# Patient Record
Sex: Male | Born: 1935 | Hispanic: Refuse to answer | Marital: Married | Smoking: Former smoker
Health system: Southern US, Community
[De-identification: ages and names within clinical notes are randomized; demographics above are authoritative.]

## PROBLEM LIST (undated history)

## (undated) DIAGNOSIS — E785 Hyperlipidemia, unspecified: Secondary | ICD-10-CM

## (undated) DIAGNOSIS — G629 Polyneuropathy, unspecified: Secondary | ICD-10-CM

## (undated) DIAGNOSIS — I10 Essential (primary) hypertension: Secondary | ICD-10-CM

## (undated) DIAGNOSIS — C4490 Unspecified malignant neoplasm of skin, unspecified: Secondary | ICD-10-CM

## (undated) HISTORY — DX: Hyperlipidemia, unspecified: E78.5

## (undated) HISTORY — DX: Essential (primary) hypertension: I10

## (undated) HISTORY — DX: Unspecified malignant neoplasm of skin, unspecified: C44.90

## (undated) HISTORY — PX: HERNIA REPAIR: SHX51

## (undated) HISTORY — DX: Polyneuropathy, unspecified: G62.9

---

## 2016-02-16 ENCOUNTER — Other Ambulatory Visit: Payer: Self-pay | Admitting: Nurse Practitioner

## 2016-02-16 DIAGNOSIS — R221 Localized swelling, mass and lump, neck: Secondary | ICD-10-CM

## 2016-02-21 ENCOUNTER — Ambulatory Visit
Admission: RE | Admit: 2016-02-21 | Discharge: 2016-02-21 | Disposition: A | Discharge status: Home / Self Care | Payer: Medicare Other | Source: Ambulatory Visit | Attending: Nurse Practitioner | Admitting: Nurse Practitioner

## 2016-02-21 DIAGNOSIS — X58XXXA Exposure to other specified factors, initial encounter: Secondary | ICD-10-CM | POA: Diagnosis not present

## 2016-02-21 DIAGNOSIS — S1085XA Superficial foreign body of other specified part of neck, initial encounter: Secondary | ICD-10-CM | POA: Insufficient documentation

## 2016-02-21 DIAGNOSIS — R221 Localized swelling, mass and lump, neck: Secondary | ICD-10-CM | POA: Diagnosis present

## 2017-01-17 IMAGING — US US SOFT TISSUE HEAD/NECK
1 series · 13 of 25 positions shown · non-contrast
Comparison: None.

CLINICAL DATA: Palpable right-sided neck mass for the past week.
Patient states he was mowing in the yard and something hit him in
the neck. Evaluate for foreign body.

EXAM:
ULTRASOUND OF HEAD/NECK SOFT TISSUES
TECHNIQUE: Ultrasound examination of the head and neck soft tissues was
performed in the area of clinical concern.

[Series 1: us soft tissue head/neck · 0.05mm/px · 25 acquisitions, 13 frames shown]
[im 1/25]
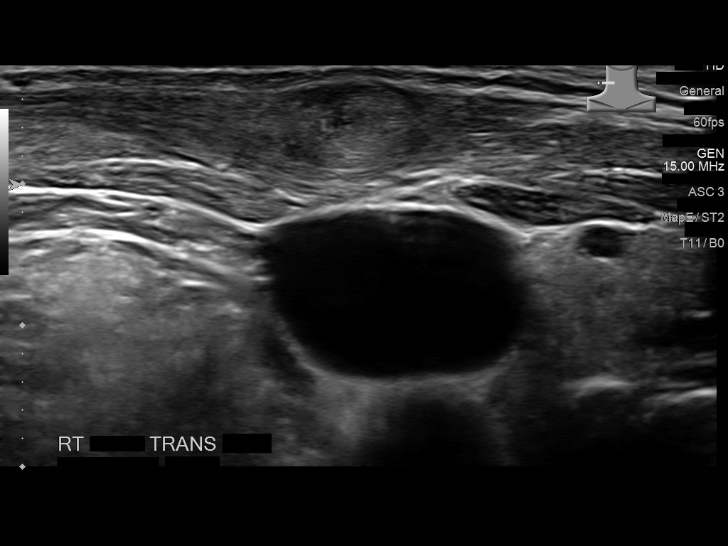
[im 3/25]
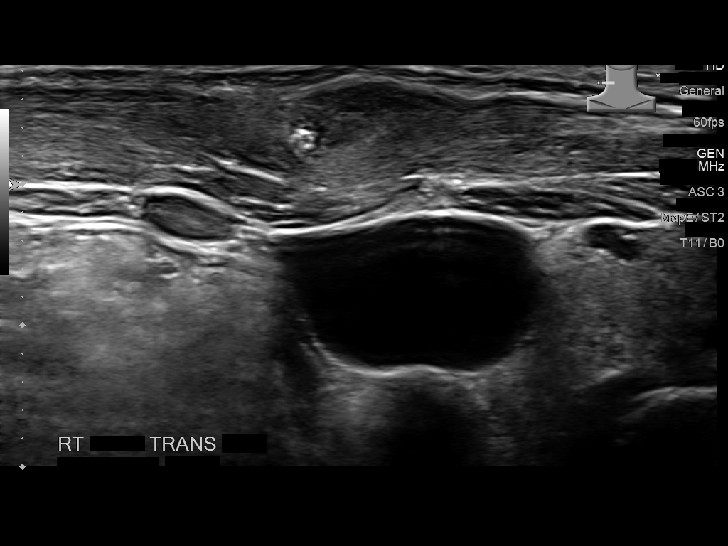
[im 5/25]
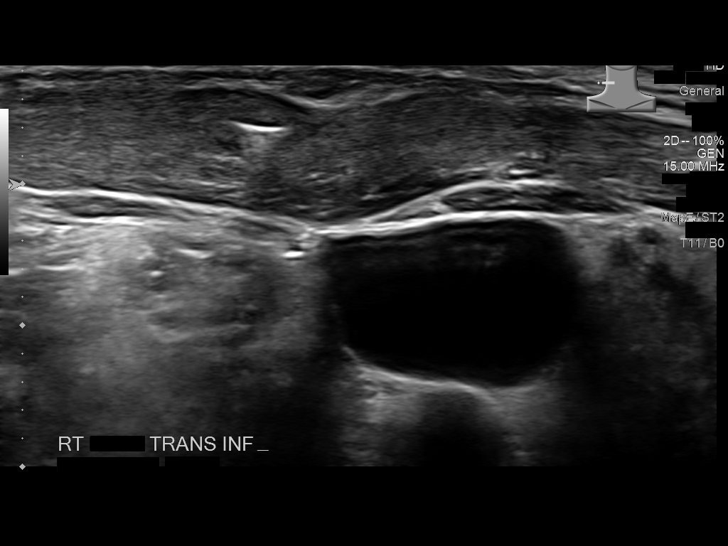
[im 7/25]
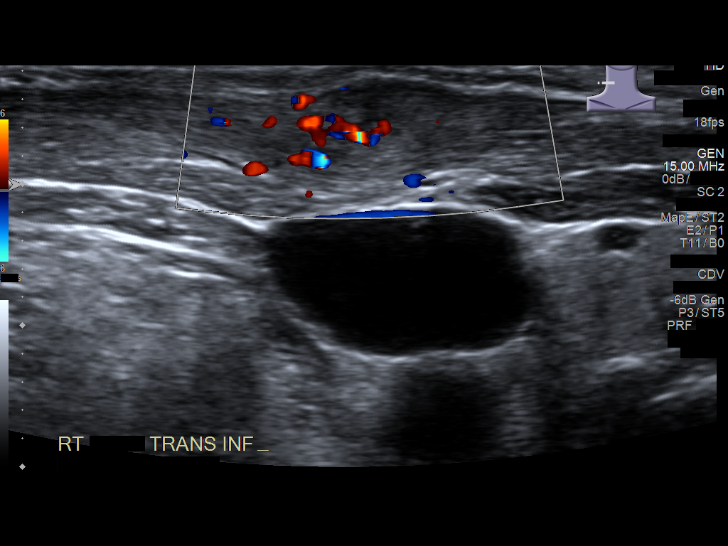
[im 9/25]
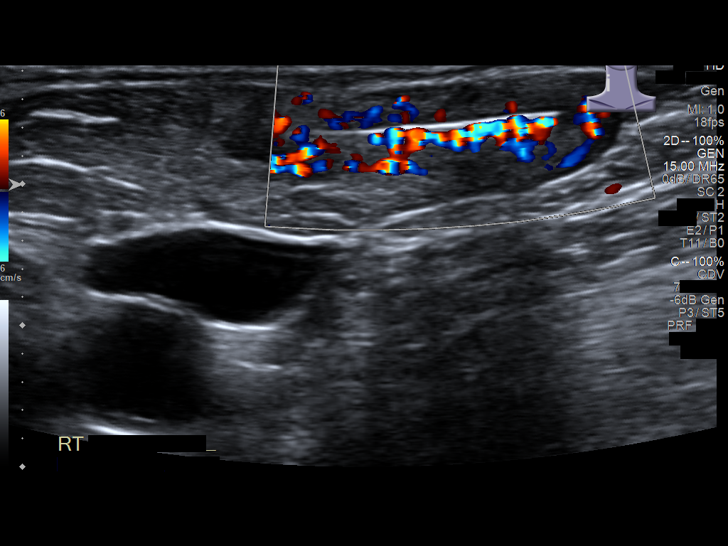
[im 11/25]
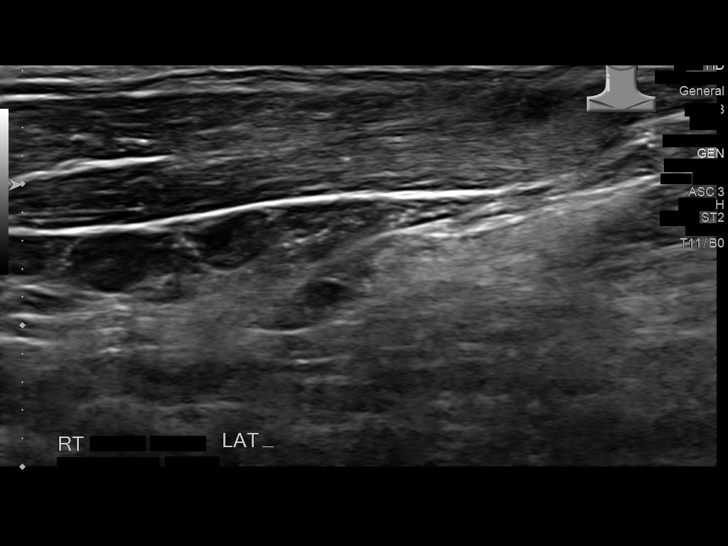
[im 13/25]
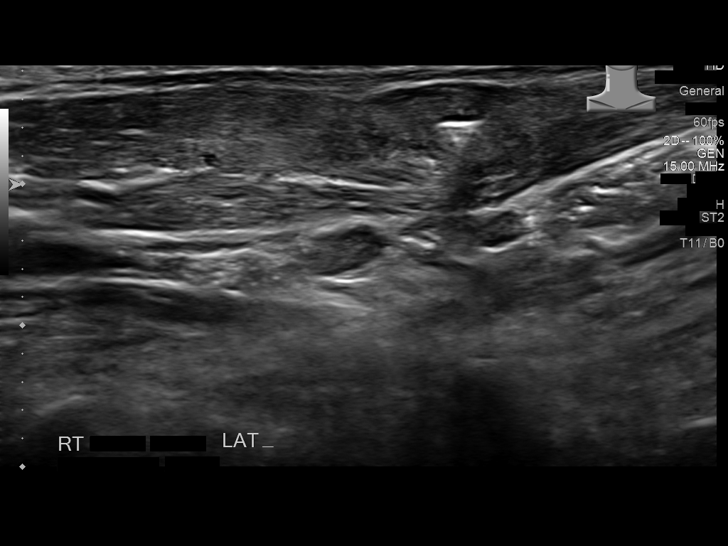
[im 15/25]
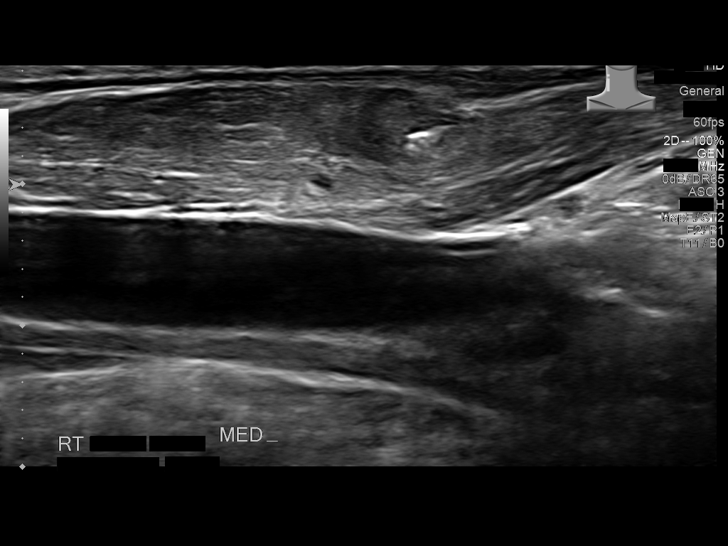
[im 17/25]
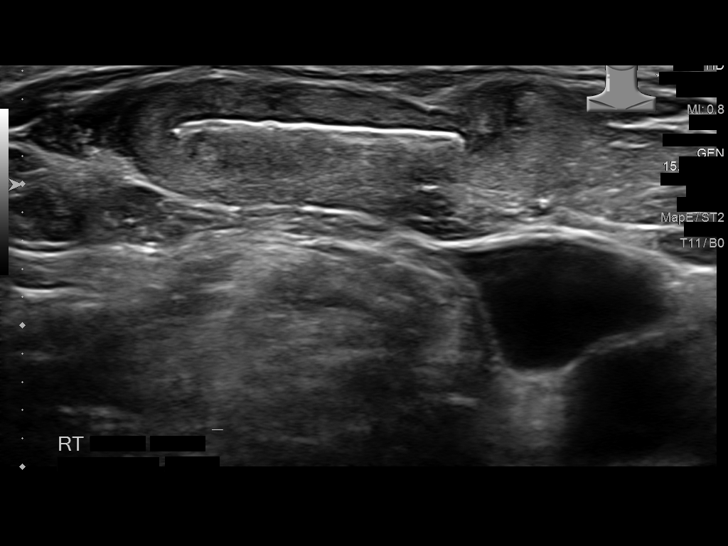
[im 19/25]
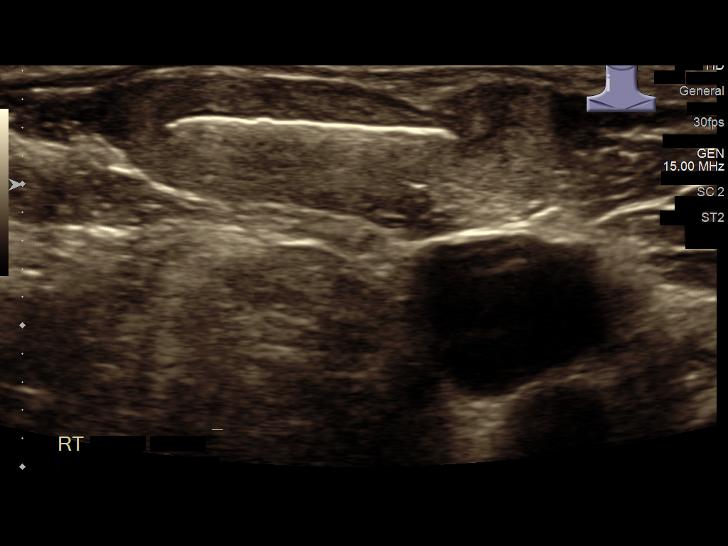
[im 21/25]
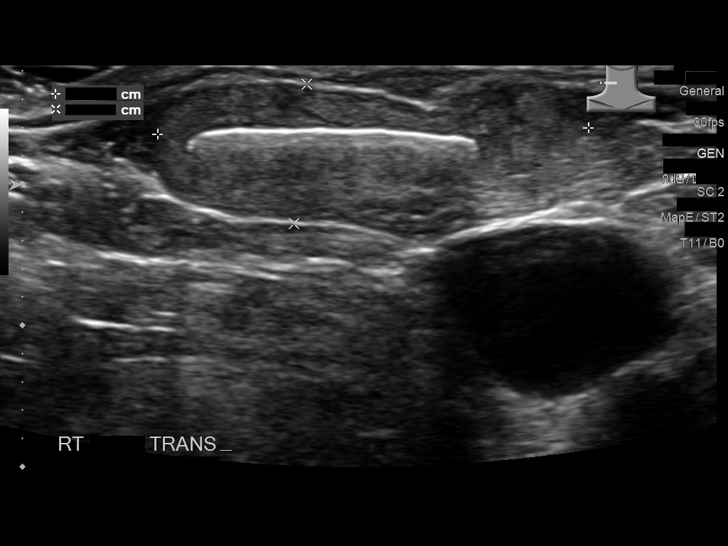
[im 23/25]
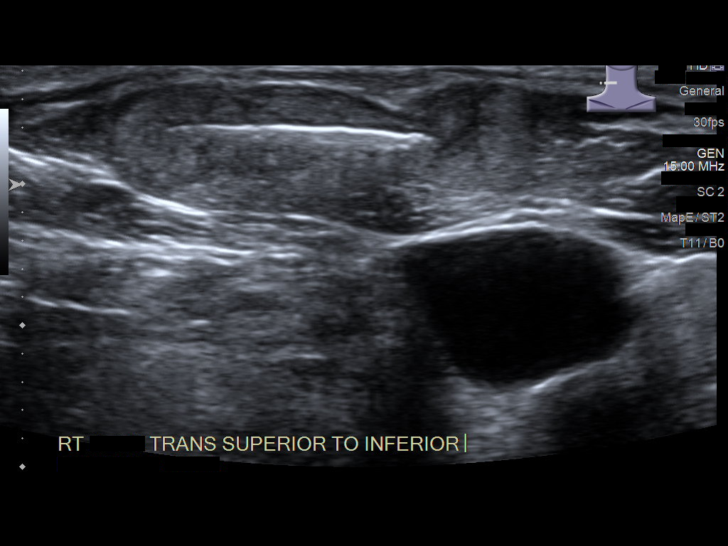
[im 25/25]
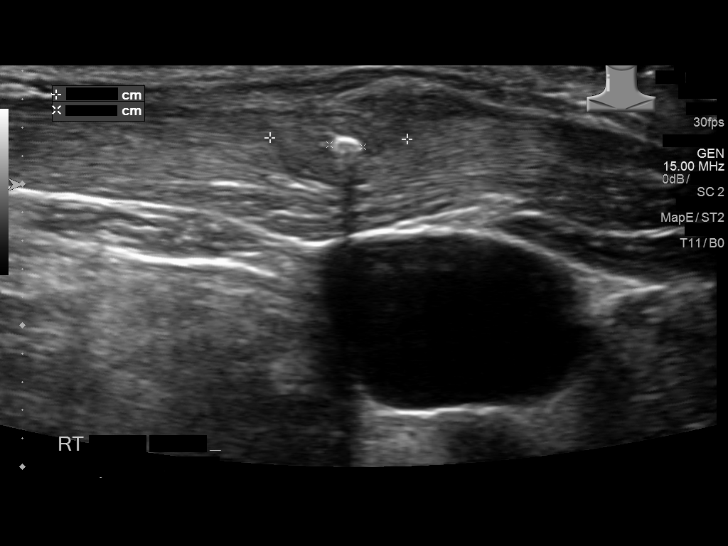

[13 of 25 positions shown; findings below may reference images not displayed]

FINDINGS: There is a approximately 2.9 x 0.4 x 0.2 cm length echogenic linear
apparent foreign body imbedded within the superficial muscles of the
right side of the neck.

This structure is noted to be echogenic with dense posterior
acoustic shadowing.

There is a minimal amount of edematous tissue surrounding this
radiopaque foreign body. No definitive definable/drainable fluid
collection.
IMPRESSION: The patient's palpable area of concern correlates with an
approximately 2.9 cm echogenic linear radiopaque foreign body which
appears imbedded within the superficial musculature of the
right-side of the neck. While there is a minimal amount of adjacent
soft tissue swelling, no definitive definable/drainable fluid
collection is seen on the present examination. Further evaluation
with dedicated soft tissue neck radiographs could be performed as
indicated. Ultimately, this patient would likely benefit from
ENT/surgical consultation.

These results will be called to the ordering clinician or
representative by the Radiologist Assistant, and communication
documented in the PACS or zVision Dashboard.

## 2017-07-16 DIAGNOSIS — Z85820 Personal history of malignant melanoma of skin: Secondary | ICD-10-CM | POA: Insufficient documentation

## 2019-05-12 DIAGNOSIS — E785 Hyperlipidemia, unspecified: Secondary | ICD-10-CM | POA: Insufficient documentation

## 2019-05-12 DIAGNOSIS — I10 Essential (primary) hypertension: Secondary | ICD-10-CM | POA: Insufficient documentation

## 2019-05-12 DIAGNOSIS — K219 Gastro-esophageal reflux disease without esophagitis: Secondary | ICD-10-CM | POA: Insufficient documentation

## 2019-05-12 DIAGNOSIS — G629 Polyneuropathy, unspecified: Secondary | ICD-10-CM | POA: Insufficient documentation

## 2019-07-22 ENCOUNTER — Encounter: Payer: Self-pay | Admitting: Urology

## 2019-07-22 ENCOUNTER — Ambulatory Visit: Payer: Medicare HMO | Admitting: Urology

## 2019-07-22 ENCOUNTER — Other Ambulatory Visit: Payer: Self-pay

## 2019-07-22 VITALS — BP 159/71 | HR 66 | Ht 69.0 in | Wt 163.1 lb

## 2019-07-22 DIAGNOSIS — N432 Other hydrocele: Secondary | ICD-10-CM | POA: Diagnosis not present

## 2019-07-22 NOTE — Progress Notes (Signed)
   07/22/19 1:09 PM   Mark Knox [birth year] [id number]  CC: Scrotal swelling  HPI: I saw Mark Knox in urology clinic today for evaluation of scrotal swelling.  He is an 84 year old healthy male who was noted multiple years of right scrotal swelling.  He thinks this is worsened over the last few months.  His history is notable for bilateral inguinal hernia repairs with mesh in the distant past over 30 years ago.  He thinks he might of had a hydrocelectomy at that time.  He has some occasional weak stream and urinary urgency, but is minimally bothered.  He denies any gross hematuria.  He drinks a fair amount of coffee, computer check, and alcohol during the day.  There is no prior imaging to review.  PMH: Foreign body in the neck Bilateral inguinal hernia repairs  Family History: No family history on file.  Physical Exam: BP (!) 159/71 (BP Location: Left Arm, Patient Position: Sitting, Cuff Size: Normal)   Pulse 66   Ht 5\' 9"  (1.753 m)   Wt 163 lb 2.3 oz (74 kg)   BMI 24.09 kg/m    Constitutional:  Alert and oriented, No acute distress. Cardiovascular: No clubbing, cyanosis, or edema. Respiratory: Normal respiratory effort, no increased work of breathing. GI: Abdomen is soft, nontender, nondistended, no abdominal masses GU: Circumcised phallus with widely patent meatus, testicles 20 cc and descended bilaterally, mild right scrotal swelling consistent with likely hydrocele Lymph: No cervical or inguinal lymphadenopathy. Skin: No rashes, bruises or suspicious lesions. Neurologic: Grossly intact, no focal deficits, moving all 4 extremities. Psychiatric: Normal mood and affect.   Assessment & Plan:   In summary, he is a healthy 84 year old male with some mild right scrotal swelling over the last few years consistent with a likely right-sided hydrocele.  He is minimally bothered by this.  We discussed next step would be a scrotal ultrasound for confirmation, and that hydroceles  are only treated surgically if patients are bothered enough to have them removed.  Surgery typically takes 30 to 45 minutes and is done through a small incision in the scrotum and patients are able to discharge the same day.  There is a small risk of bleeding, infection, or recurrence.  It is normal to have some scrotal swelling for a few weeks during the healing process.  We also reviewed behavioral strategies regarding his bladder symptoms.  He would like to hold off on scrotal ultrasound or any other work-up at this time as he is minimally bothered.  RTC 1 year for symptom check, re-discuss scrotal ultrasound/hydrocelectomy  I spent 30 total minutes on the day of the encounter including pre-visit review of the medical record, face-to-face time with the patient, and post visit ordering of labs/imaging/tests.  Nickolas Madrid, MD 07/22/2019  [facility] [address], [place], [place] [address] [phone]

## 2019-07-22 NOTE — Patient Instructions (Signed)
Hydrocelectomy, Adult  A hydrocelectomy is a surgical procedure to remove a collection of fluid (hydrocele) from the scrotum, which is the pouch that holds the testicles. You may need to have this procedure if a hydrocele is causing painful swelling in your scrotum. Tell a health care provider about:  Any allergies you have.  All medicines you are taking, including vitamins, herbs, eye drops, creams, and over-the-counter medicines.  Any problems you or family members have had with anesthetic medicines.  Any blood disorders you have.  Any surgeries you have had.  Any medical conditions you have. What are the risks? Generally, this is a safe procedure. However, problems may occur, including:  Bleeding into the scrotum (scrotal hematoma).  Damage to nearby structures or organs, including to the testicle or the tube that carries sperm out of the testicle (vas deferens).  Infection.  Allergic reactions to medicines. What happens before the procedure? Staying hydrated Follow instructions from your health care provider about hydration, which may include:  Up to 2 hours before the procedure - you may continue to drink clear liquids, such as water, clear fruit juice, black coffee, and plain tea. Eating and drinking restrictions Follow instructions from your health care provider about eating and drinking, which may include:  8 hours before the procedure - stop eating heavy meals or foods, such as meat, fried foods, or fatty foods.  6 hours before the procedure - stop eating light meals or foods, such as toast or cereal.  6 hours before the procedure - stop drinking milk or drinks that contain milk.  2 hours before the procedure - stop drinking clear liquids. Medicines Ask your health care provider about:  Changing or stopping your regular medicines. This is especially important if you are taking diabetes medicines or blood thinners.  Taking medicines such as aspirin and ibuprofen.  These medicines can thin your blood. Do not take these medicines unless your health care provider tells you to take them.  Taking over-the-counter medicines, vitamins, herbs, and supplements. General instructions  Do not use any products that contain nicotine or tobacco for at least 4 weeks before the procedure. These products include cigarettes, e-cigarettes, and chewing tobacco. If you need help quitting, ask your health care provider.  Plan to have someone take you home from the hospital or clinic.  Plan to have a responsible adult care for you for at least 24 hours after you leave the hospital or clinic. This is important.  Ask your health care provider: ? How your surgery site will be marked. ? What steps will be taken to help prevent infection. These may include:  Removing hair at the surgery site.  Washing skin with a germ-killing soap.  Taking antibiotic medicine. What happens during the procedure?  An IV will be inserted into one of your veins.  You will be given one or more of the following: ? A medicine to make you relax (sedative). ? A medicine to make you fall asleep (general anesthetic).  A small incision will be made through the skin of your scrotum.  Your testicle and the hydrocele will be located, and the hydrocele sac will be opened with an incision.  The fluid will be drained from the hydrocele. Part of the hydrocele sac may be removed.  The hydrocele will be closed with stitches that dissolve (absorbable sutures). This prevents fluid from building up again.  If your hydrocele is large, you may have a thin, rubber drain placed to allow fluid to drain  after the procedure.  The incision in your scrotum will be closed with absorbable sutures, skin glue, or adhesives.  A bandage (dressing) will be placed over the incision. The dressing may be held in place with an athletic support strap (scrotal support). The procedure may vary among health care providers and  hospitals. What happens after the procedure?   Your blood pressure, heart rate, breathing rate, and blood oxygen level will be monitored until you leave the hospital or clinic.  You will be given pain medicine as needed.  Your IV will be removed, and your insertion site will be checked for bleeding.  Do not drive for 24 hours if you were given a sedative during your procedure.  You may need to wear a scrotal support. This holds the dressing in place and supports your scrotum. Summary  A hydrocelectomy is a surgical procedure to remove a collection of fluid (hydrocele) from the scrotum, which is the pouch that holds the testicles. You may need to have this procedure if a hydrocele is causing painful swelling in your scrotum.  During the procedure, the hydrocele will be drained and then closed with stitches that dissolve (absorbable sutures). This prevents fluid from building up again.  If your hydrocele is large, you may have a thin, rubber drain placed to allow fluid to drain after the procedure.  You may need to wear a scrotal support after your procedure. This holds the dressing in place and supports your scrotum. This information is not intended to replace advice given to you by your health care provider. Make sure you discuss any questions you have with your health care provider. Document Revised: 09/24/2018 Document Reviewed: 09/24/2018 Elsevier Patient Education  2020 Else[employer] Inc.   Hydrocele, Adult A hydrocele is a collection of fluid in the loose pouch of skin that holds the testicles (scrotum). This may happen because:  The amount of fluid produced in the scrotum is not absorbed by the rest of the body.  Fluid from the abdomen fills the scrotum. Normally, the testicles develop in the abdomen then move (drop) into to the scrotum before birth. The tube that the testicles travel through usually closes after the testicles drop. If the tube does not close, fluid from the abdomen  can fill the scrotum. This is less common in adults. What are the causes? The cause of a hydrocele in adults is usually not known. However, it may be caused by:  An injury to the scrotum.  An infection (epididymitis).  Decreased blood flow to the scrotum.  Twisting of a testicle (testicular torsion).  A birth defect.  A tumor or cancer of the testicle. What are the signs or symptoms? A hydrocele feels like a water-filled balloon. It may also feel heavy. Other symptoms include:  Swelling of the scrotum. The swelling may decrease when you lie down. You may also notice more swelling at night than in the morning.  Swelling of the groin.  Mild discomfort in the scrotum.  Pain. This can develop if the hydrocele was caused by infection or twisting. The larger the hydrocele, the more likely you are to have pain. How is this diagnosed? This condition may be diagnosed based on:  Physical exam.  Medical history. You may also have other tests, including:  Imaging tests, such as ultrasound.  Blood or urine tests. How is this treated? Most hydroceles go away on their own. If you have no discomfort or pain, your health care provider may suggest close monitoring of your   condition (called watch and wait or watchful waiting) until the condition goes away or symptoms develop. If treatment is needed, it may include:  Treating an underlying condition. This may include using an antibiotic medicine to treat an infection.  Surgery to stop fluid from collecting in the scrotum.  Surgery to drain the fluid. Options include: ? Needle aspiration. A needle is used to drain fluid. However, the fluid buildup will come back quickly. ? Hydrocelectomy. For this procedure, an incision is made in the scrotum to remove the fluid sac. Follow these instructions at home:  Watch the hydrocele for any changes.  Take over-the-counter and prescription medicines only as told by your health care provider.  If  you were prescribed an antibiotic medicine, use it as told by your health care provider. Do not stop taking the antibiotic even if you start to feel better.  Keep all follow-up visits as told by your health care provider. This is important. Contact a health care provider if:  You notice any changes in the hydrocele.  The swelling in your scrotum or groin gets worse.  The hydrocele becomes red, firm, painful, or tender to the touch.  You have a fever. Get help right away if you:  Develop a lot of pain, or your pain becomes worse. Summary  A hydrocele is a collection of fluid in the loose pouch of skin that holds the testicles (scrotum).  Hydroceles can cause swelling, discomfort, and sometimes pain.  In adults, the cause of a hydrocele usually is not known. However, it is sometimes caused by an infection or a rotation and twisting of the scrotum.  Treatment is usually not needed. Hydroceles often go away on their own. If a hydrocele causes pain, treatment may be given to ease the pain. This information is not intended to replace advice given to you by your health care provider. Make sure you discuss any questions you have with your health care provider. Document Revised: 05/12/2017 Document Reviewed: 05/12/2017 Elsevier Patient Education  2020 Elsevier Inc.   

## 2020-07-20 ENCOUNTER — Ambulatory Visit: Payer: Medicare HMO | Admitting: Urology

## 2020-07-20 ENCOUNTER — Ambulatory Visit: Payer: Self-pay | Admitting: Urology

## 2020-12-15 ENCOUNTER — Other Ambulatory Visit (INDEPENDENT_AMBULATORY_CARE_PROVIDER_SITE_OTHER): Payer: Self-pay | Admitting: Nurse Practitioner

## 2020-12-15 DIAGNOSIS — R0989 Other specified symptoms and signs involving the circulatory and respiratory systems: Secondary | ICD-10-CM

## 2020-12-17 ENCOUNTER — Encounter (INDEPENDENT_AMBULATORY_CARE_PROVIDER_SITE_OTHER): Payer: Self-pay | Admitting: Nurse Practitioner

## 2020-12-17 ENCOUNTER — Encounter (INDEPENDENT_AMBULATORY_CARE_PROVIDER_SITE_OTHER): Payer: Self-pay

## 2020-12-22 ENCOUNTER — Ambulatory Visit (INDEPENDENT_AMBULATORY_CARE_PROVIDER_SITE_OTHER): Payer: Medicare HMO

## 2020-12-22 ENCOUNTER — Ambulatory Visit (INDEPENDENT_AMBULATORY_CARE_PROVIDER_SITE_OTHER): Payer: Medicare HMO | Admitting: Nurse Practitioner

## 2020-12-22 ENCOUNTER — Encounter (INDEPENDENT_AMBULATORY_CARE_PROVIDER_SITE_OTHER): Payer: Self-pay | Admitting: Nurse Practitioner

## 2020-12-22 ENCOUNTER — Other Ambulatory Visit: Payer: Self-pay

## 2020-12-22 VITALS — BP 158/78 | HR 66 | Resp 16 | Ht 69.0 in | Wt 169.0 lb

## 2020-12-22 DIAGNOSIS — E785 Hyperlipidemia, unspecified: Secondary | ICD-10-CM

## 2020-12-22 DIAGNOSIS — R0989 Other specified symptoms and signs involving the circulatory and respiratory systems: Secondary | ICD-10-CM | POA: Diagnosis not present

## 2020-12-22 DIAGNOSIS — M79674 Pain in right toe(s): Secondary | ICD-10-CM | POA: Diagnosis not present

## 2020-12-22 DIAGNOSIS — G629 Polyneuropathy, unspecified: Secondary | ICD-10-CM | POA: Diagnosis not present

## 2020-12-22 DIAGNOSIS — I10 Essential (primary) hypertension: Secondary | ICD-10-CM

## 2020-12-22 DIAGNOSIS — M79675 Pain in left toe(s): Secondary | ICD-10-CM

## 2020-12-22 NOTE — Progress Notes (Signed)
Subjective:    Patient ID: Mark Knox, male    DOB: [birth year], 85 y.o.   MRN: [id number] Chief Complaint  Patient presents with   New Patient (Initial Visit)    Ref Feldpausch abnormal foot pulse,essential hypertension    Mark Knox is an 85 year old male that presents today for evaluation after a referral from Dr. Ellison Hughs in regards to abnormal pulses.  The patient also notes that he has pain in his toes.  This sensation has been going on for approximately last 4 to 5 years.  His toes hurt daily.  He describes the pain from being a 1 to a 3 and at times a 4.  This pain is constant in both feet and sometimes it is worse overnight.  However the patient denies any claudication-like symptoms.  He denies any rest pain like symptoms.  There are no wounds or ulcerations.  Today noninvasive study shows an ABI of 1.02 on the right and 1.01 on the left.  Patient has a TBI 0.88 on the right and 0.98 on the left.  The patient has triphasic tibial artery waveforms bilaterally with good toe waveforms bilaterally.  No evidence of peripheral arterial disease is noted today.   Review of Systems  Neurological:  Positive for numbness.      Objective:   Physical Exam Vitals reviewed.  HENT:     Head: Normocephalic.  Cardiovascular:     Rate and Rhythm: Normal rate.     Pulses:          Dorsalis pedis pulses are 1+ on the right side and 1+ on the left side.       Posterior tibial pulses are 1+ on the right side and 1+ on the left side.  Pulmonary:     Effort: Pulmonary effort is normal.  Neurological:     Mental Status: He is alert and oriented to person, place, and time.  Psychiatric:        Mood and Affect: Mood normal.        Behavior: Behavior normal.        Thought Content: Thought content normal.        Judgment: Judgment normal.    BP (!) 158/78 (BP Location: Right Arm)   Pulse 66   Resp 16   Ht '5\' 9"'$  (1.753 m)   Wt 169 lb (76.7 kg)   BMI 24.96 kg/m   Past Medical  History:  Diagnosis Date   Hyperlipidemia    Hypertension    Neuropathy    Skin cancer     Social History   Socioeconomic History   Marital status: Married    Spouse name: Not on file   Number of children: Not on file   Years of education: Not on file   Highest education level: Not on file  Occupational History   Not on file  Tobacco Use   Smoking status: Former   Smokeless tobacco: Never  Substance and Sexual Activity   Alcohol use: Yes   Drug use: Never   Sexual activity: Yes    Birth control/protection: None  Other Topics Concern   Not on file  Social History Narrative   Not on file   Social Determinants of Health   Financial Resource Strain: Not on file  Food Insecurity: Not on file  Transportation Needs: Not on file  Physical Activity: Not on file  Stress: Not on file  Social Connections: Not on file  Intimate Partner Violence: Not on file  Past Surgical History:  Procedure Laterality Date   HERNIA REPAIR      History reviewed. No pertinent family history.  No Known Allergies  No flowsheet data found.    CMP  No results found for: NA, K, CL, CO2, GLUCOSE, BUN, CREATININE, CALCIUM, PROT, ALBUMIN, AST, ALT, ALKPHOS, BILITOT, GFRNONAA, GFRAA   No results found.     Assessment & Plan:   1. Peripheral polyneuropathy The patient notes that many years ago in [place] he had test done of his lower extremities.  He he believes that this was a test for neuropathy and he also believes that it was positive.  Based on the patient's description of symptoms as well as his noninvasive studies today this is very likely.  We will defer to the primary care provider for further treatment and evaluation.  2. Essential hypertension Continue antihypertensive medications as already ordered, these medications have been reviewed and there are no changes at this time.   3. Hyperlipidemia, unspecified hyperlipidemia type Continue statin as ordered and reviewed, no  changes at this time   4. Pain in toes of both feet Recommend:  I do not find evidence of Vascular pathology that would explain the patient's symptoms  The patient has atypical pain symptoms for vascular disease  I do not find evidence of Vascular pathology that would explain the patient's symptoms and I suspect the patient is c/o neuropathy.  This will be deferred to the primary care provider to decide if he will treat or if referral to neurology is more appropriate.   Noninvasive studies of the legs do not identify vascular problems  The patient should continue walking and begin a more formal exercise program. The patient should continue his aggressive treatment of the lipid abnormalities.  Patient will follow-up with me on a PRN basis  Further work-up of her lower extremity pain is deferred to the primary service       Current Outpatient Medications on File Prior to Visit  Medication Sig Dispense Refill   hydrochlorothiazide (MICROZIDE) 12.5 MG capsule Take by mouth.     lisinopril (ZESTRIL) 20 MG tablet Take by mouth.     Multiple Vitamins-Minerals (PX COMPLETE SENIOR MULTIVITS) TABS      omeprazole (PRILOSEC) 20 MG capsule Take 20 mg by mouth every other day.     rosuvastatin (CRESTOR) 10 MG tablet Take 10 mg by mouth at bedtime.     cimetidine (TAGAMET) 200 MG tablet Take by mouth. (Patient not taking: Reported on 12/22/2020)     Multiple Vitamin (MULTI-VITAMIN) tablet Take by mouth. (Patient not taking: Reported on 12/22/2020)     simvastatin (ZOCOR) 40 MG tablet  (Patient not taking: Reported on 12/22/2020)     terbinafine (LAMISIL) 250 MG tablet Take 250 mg by mouth daily. (Patient not taking: Reported on 12/22/2020)     triamcinolone (KENALOG) 0.1 % paste USE AS DIRECTED (Patient not taking: Reported on 12/22/2020)     No current facility-administered medications on file prior to visit.    There are no Patient Instructions on file for this visit. No follow-ups on  file.   Kris Hartmann, NP

## 2020-12-30 ENCOUNTER — Other Ambulatory Visit: Payer: Self-pay

## 2020-12-30 ENCOUNTER — Ambulatory Visit
Admission: EM | Admit: 2020-12-30 | Discharge: 2020-12-30 | Disposition: A | Discharge status: Home / Self Care | Payer: Medicare HMO | Attending: Family Medicine | Admitting: Family Medicine

## 2020-12-30 ENCOUNTER — Encounter: Payer: Self-pay | Admitting: Emergency Medicine

## 2020-12-30 DIAGNOSIS — H6123 Impacted cerumen, bilateral: Secondary | ICD-10-CM

## 2020-12-30 NOTE — ED Provider Notes (Signed)
[facility]    CSN: [id number] Arrival date & time: 12/30/20  1148      History   Chief Complaint Chief Complaint  Patient presents with   Hearing Problem    Right ear    HPI 85 year old male presents with the above complaint.  Patient reports difficulty hearing out of his right ear.  He believes that he has a cerumen impaction.  He wears hearing aids.  He has tried Debrox and peroxide without resolution.  No pain.  No other associated symptoms.  No other complaints.  Past Medical History:  Diagnosis Date   Hyperlipidemia    Hypertension    Neuropathy    Skin cancer     Patient Active Problem List   Diagnosis Date Noted   Essential hypertension 05/12/2019   GERD without esophagitis 05/12/2019   Hyperlipidemia 05/12/2019   Peripheral polyneuropathy 05/12/2019   History of malignant melanoma of skin 07/16/2017    Past Surgical History:  Procedure Laterality Date   HERNIA REPAIR         Home Medications    Prior to Admission medications   Medication Sig Start Date End Date Taking? Authorizing Provider  cimetidine (TAGAMET) 200 MG tablet Take by mouth. Patient not taking: Reported on 12/22/2020    [provider]  hydrochlorothiazide (MICROZIDE) 12.5 MG capsule Take by mouth. 07/03/18   [provider]  lisinopril (ZESTRIL) 20 MG tablet Take by mouth. 07/03/18   [provider]  Multiple Vitamin (MULTI-VITAMIN) tablet Take by mouth. Patient not taking: Reported on 12/22/2020    [provider]  Multiple Vitamins-Minerals ([place]) TABS  04/15/19   [provider]  omeprazole (PRILOSEC) 20 MG capsule Take 20 mg by mouth every other day. 09/20/20   [provider]  rosuvastatin (CRESTOR) 10 MG tablet Take 10 mg by mouth at bedtime. 05/21/19   [provider]  simvastatin (ZOCOR) 40 MG tablet  04/15/19   [provider]  terbinafine (LAMISIL) 250 MG tablet Take 250 mg  by mouth daily. Patient not taking: Reported on 12/22/2020 06/17/19   [provider]  triamcinolone (KENALOG) 0.1 % paste USE AS DIRECTED Patient not taking: Reported on 12/22/2020 10/08/18   [provider]    Family History No family history on file.  Social History Social History   Tobacco Use   Smoking status: Former   Smokeless tobacco: Never  Substance Use Topics   Alcohol use: Yes   Drug use: Never     Allergies   Patient has no known allergies.   Review of Systems Review of Systems Per HPI  Physical Exam Triage Vital Signs ED Triage Vitals  Enc Vitals Group     BP 12/30/20 1227 (!) 154/67     Pulse Rate 12/30/20 1227 (!) 55     Resp 12/30/20 1227 16     Temp 12/30/20 1227 99 F (37.2 C)     Temp Source 12/30/20 1227 Oral     SpO2 12/30/20 1227 98 %     Weight --      Height --      Head Circumference --      Peak Flow --      Pain Score 12/30/20 1225 0     Pain Loc --      Pain Edu? --      Excl. in [place]? --    Updated Vital Signs BP (!) 154/67 (BP Location: Left Arm)   Pulse Marland Kitchen)  55   Temp 99 F (37.2 C) (Oral)   Resp 16   SpO2 98%   Visual Acuity Right Eye Distance:   Left Eye Distance:   Bilateral Distance:    Right Eye Near:   Left Eye Near:    Bilateral Near:     Physical Exam Constitutional:      General: He is not in acute distress.    Appearance: Normal appearance. He is not ill-appearing.  HENT:     Head: Normocephalic and atraumatic.     Right Ear: There is impacted cerumen.     Left Ear: There is impacted cerumen.  Eyes:     General:        Right eye: No discharge.        Left eye: No discharge.     Conjunctiva/sclera: Conjunctivae normal.  Pulmonary:     Effort: Pulmonary effort is normal. No respiratory distress.  Neurological:     Mental Status: He is alert.  Psychiatric:        Mood and Affect: Mood normal.        Behavior: Behavior normal.    UC Treatments / Results  Labs (all labs ordered are  listed, but only abnormal results are displayed) Labs Reviewed - No data to display  EKG   Radiology No results found.  Procedures Procedures (including critical care time)  Medications Ordered in UC Medications - No data to display  Initial Impression / Assessment and Plan / UC Course  I have reviewed the triage vital signs and the nursing notes.  Pertinent labs & imaging results that were available during my care of the patient were reviewed by me and considered in my medical decision making (see chart for details).    85 year old male presents with bilateral cerumen impaction.  Successful lavage today.  Supportive care.  Final Clinical Impressions(s) / UC Diagnoses   Final diagnoses:  Bilateral impacted cerumen   Discharge Instructions   None    ED Prescriptions   None    PDMP not reviewed this encounter.   Coral Spikes, DO 12/30/20 1332

## 2020-12-30 NOTE — ED Triage Notes (Signed)
Decreased ability to hear in right ear. He has tried debrox without relief.

## 2024-02-07 ENCOUNTER — Ambulatory Visit
Admission: RE | Admit: 2024-02-07 | Discharge: 2024-02-07 | Disposition: A | Discharge status: Home / Self Care | Attending: Emergency Medicine | Admitting: Emergency Medicine

## 2024-02-07 VITALS — BP 139/57 | HR 88 | Temp 97.8°F | Resp 16 | Wt 164.0 lb

## 2024-02-07 DIAGNOSIS — H6123 Impacted cerumen, bilateral: Secondary | ICD-10-CM

## 2024-02-07 NOTE — ED Provider Notes (Signed)
 [facility]    CSN: [id number] Arrival date & time: 02/07/24  9063      History   Chief Complaint Chief Complaint  Patient presents with   Ear Fullness    HPI Abishai Viegas is a 88 y.o. male.   HPI  88 year old male with past medical history significant for essential hypertension, GERD, malignant melanoma, hyperlipidemia, peripheral neuropathy, and hearing loss and using hearing aids presents for evaluation of decreased hearing in both ears for the past week.  He reports that he has been using wax removal oil and that his right ear has somewhat improved but the oil made his left ear worse.  He denies any ringing in the ear, drainage, or fever.  Past Medical History:  Diagnosis Date   Hyperlipidemia    Hypertension    Neuropathy    Skin cancer     Patient Active Problem List   Diagnosis Date Noted   Essential hypertension 05/12/2019   GERD without esophagitis 05/12/2019   Hyperlipidemia 05/12/2019   Peripheral polyneuropathy 05/12/2019   History of malignant melanoma of skin 07/16/2017    Past Surgical History:  Procedure Laterality Date   HERNIA REPAIR         Home Medications    Prior to Admission medications   Medication Sig Start Date End Date Taking? Authorizing Provider  hydrochlorothiazide (MICROZIDE) 12.5 MG capsule Take by mouth. 07/03/18  Yes [provider]  lisinopril (ZESTRIL) 20 MG tablet Take by mouth. 07/03/18  Yes [provider]  Multiple Vitamins-Minerals (PX COMPLETE SENIOR MULTIVITS) TABS  04/15/19  Yes [provider]  omeprazole (PRILOSEC) 20 MG capsule Take 20 mg by mouth every other day. 09/20/20  Yes [provider]  cimetidine (TAGAMET) 200 MG tablet Take by mouth. Patient not taking: Reported on 12/22/2020    [provider]  Multiple Vitamin (MULTI-VITAMIN) tablet Take by mouth. Patient not taking: Reported on 12/22/2020    [provider]  rosuvastatin (CRESTOR) 10 MG  tablet Take 10 mg by mouth at bedtime. 05/21/19   [provider]  simvastatin (ZOCOR) 40 MG tablet  04/15/19   [provider]  terbinafine (LAMISIL) 250 MG tablet Take 250 mg by mouth daily. Patient not taking: Reported on 12/22/2020 06/17/19   [provider]  triamcinolone (KENALOG) 0.1 % paste USE AS DIRECTED Patient not taking: Reported on 12/22/2020 10/08/18   [provider]    Family History History reviewed. No pertinent family history.  Social History Social History   Tobacco Use   Smoking status: Former   Smokeless tobacco: Never  Substance Use Topics   Alcohol use: Yes   Drug use: Never     Allergies   Patient has no known allergies.   Review of Systems Review of Systems  Constitutional:  Negative for fever.  HENT:  Positive for hearing loss. Negative for ear discharge, ear pain and tinnitus.      Physical Exam Triage Vital Signs ED Triage Vitals [02/07/24 0953]  Encounter Vitals Group     BP      Girls Systolic BP Percentile      Girls Diastolic BP Percentile      Boys Systolic BP Percentile      Boys Diastolic BP Percentile      Pulse      Resp      Temp      Temp src      SpO2      Weight 164 lb (74.4  kg)     Height      Head Circumference      Peak Flow      Pain Score 0     Pain Loc      Pain Education      Exclude from Growth Chart    No data found.  Updated Vital Signs BP (!) 139/57 (BP Location: Right Arm)   Pulse 88   Temp 97.8 F (36.6 C) (Oral)   Resp 16   Wt 164 lb (74.4 kg)   SpO2 97%   BMI 24.22 kg/m   Visual Acuity Right Eye Distance:   Left Eye Distance:   Bilateral Distance:    Right Eye Near:   Left Eye Near:    Bilateral Near:     Physical Exam Vitals and nursing note reviewed.  Constitutional:      Appearance: Normal appearance. He is not ill-appearing.  HENT:     Head: Normocephalic and atraumatic.     Right Ear: External ear normal. There is impacted cerumen.     Left  Ear: External ear normal. There is impacted cerumen.     Ears:     Comments: Dry, light yellow cerumen occluding both external auditory canals. Skin:    General: Skin is warm and dry.     Capillary Refill: Capillary refill takes less than 2 seconds.     Findings: No rash.  Neurological:     General: No focal deficit present.     Mental Status: He is alert and oriented to person, place, and time.      UC Treatments / Results  Labs (all labs ordered are listed, but only abnormal results are displayed) Labs Reviewed - No data to display  EKG   Radiology No results found.  Procedures Procedures (including critical care time)  Medications Ordered in UC Medications - No data to display  Initial Impression / Assessment and Plan / UC Course  I have reviewed the triage vital signs and the nursing notes.  Pertinent labs & imaging results that were available during my care of the patient were reviewed by me and considered in my medical decision making (see chart for details).   Patient is a nontoxic-appearing 88 year old male presenting for evaluation of decreased hearing in both ears as outlined in the HPI above.  His physical exam does reveal cerumen impaction bilaterally.  I will have staff lavage both ear canals to remove the existing wax and see if the patient's symptoms improved.  Following ear lavage the right external auditory canal is clear and I can clearly visualize the tympanic membrane which is pearly gray in appearance.  The left EAC remains clogged with cerumen.  I reached out to [facility] and they are unable to see the patient today.  The next available appointment is October 10.  I discussed this option with the patient and we will give him the contact information and he can make an appointment on his own.  He will continue to utilize Debrox at home to see if he can soften the remainder of the wax.   Final Clinical Impressions(s) / UC Diagnoses   Final diagnoses:   Bilateral hearing loss due to cerumen impaction     Discharge Instructions      Purchase and use over-the-counter Debrox eardrops.  Instill 4 drops in your left ear 5 minutes before you shower to soften the wax.  When you are in the shower turn your ear to the spicket and  let the water fill your ear, turn your head over and dump the water out.  Repeat this several times to help flush out the remaining earwax.  If you do not have any resolution of your symptoms, or your symptoms worsen, I would recommend following up with an ENT specialist.  The phone number for [facility] is [phone].     ED Prescriptions   None    PDMP not reviewed this encounter.   Bernardino Ditch, NP 02/07/24 [phone]

## 2024-02-07 NOTE — ED Triage Notes (Addendum)
 Patient states that he can't hear of out both of his ears. Sx x 1 week patient tried to treat his ear with wax removing oils. The oil made the left ear worse.

## 2024-02-07 NOTE — Discharge Instructions (Addendum)
 Purchase and use over-the-counter Debrox eardrops.  Instill 4 drops in your left ear 5 minutes before you shower to soften the wax.  When you are in the shower turn your ear to the spicket and let the water fill your ear, turn your head over and dump the water out.  Repeat this several times to help flush out the remaining earwax.  If you do not have any resolution of your symptoms, or your symptoms worsen, I would recommend following up with an ENT specialist.  The phone number for [facility] is [phone].
